# Patient Record
Sex: Female | Born: 1970 | Race: White | Hispanic: No | Marital: Single
Health system: Southern US, Community
[De-identification: ages and names within clinical notes are randomized; demographics above are authoritative.]

---

## 2020-10-06 ENCOUNTER — Emergency Department (HOSPITAL_COMMUNITY): Payer: 59

## 2020-10-06 ENCOUNTER — Encounter (HOSPITAL_COMMUNITY): Payer: Self-pay | Admitting: Emergency Medicine

## 2020-10-06 ENCOUNTER — Emergency Department (HOSPITAL_COMMUNITY)
Admission: EM | Admit: 2020-10-06 | Discharge: 2020-10-06 | Disposition: A | Discharge status: Home / Self Care | Payer: 59 | Attending: Emergency Medicine | Admitting: Emergency Medicine

## 2020-10-06 DIAGNOSIS — Z5321 Procedure and treatment not carried out due to patient leaving prior to being seen by health care provider: Secondary | ICD-10-CM | POA: Diagnosis not present

## 2020-10-06 DIAGNOSIS — R202 Paresthesia of skin: Secondary | ICD-10-CM | POA: Diagnosis not present

## 2020-10-06 DIAGNOSIS — R079 Chest pain, unspecified: Secondary | ICD-10-CM | POA: Diagnosis not present

## 2020-10-06 LAB — TROPONIN I (HIGH SENSITIVITY): Troponin I (High Sensitivity): 4 ng/L (ref <18)

## 2020-10-06 LAB — CBC
HCT: 43.9 % (ref 36.0–46.0)
Hemoglobin: 13.8 g/dL (ref 12.0–15.0)
MCH: 28.3 pg (ref 26.0–34.0)
MCHC: 31.4 g/dL (ref 30.0–36.0)
MCV: 90 fL (ref 80.0–100.0)
Platelets: 264 10*3/uL (ref 150–400)
RBC: 4.88 MIL/uL (ref 3.87–5.11)
RDW: 13.1 % (ref 11.5–15.5)
WBC: 9.6 10*3/uL (ref 4.0–10.5)
nRBC: 0 % (ref 0.0–0.2)

## 2020-10-06 LAB — BASIC METABOLIC PANEL
Anion gap: 12 (ref 5–15)
BUN: 14 mg/dL (ref 6–20)
CO2: 22 mmol/L (ref 22–32)
Calcium: 9.2 mg/dL (ref 8.9–10.3)
Chloride: 103 mmol/L (ref 98–111)
Creatinine, Ser: 0.77 mg/dL (ref 0.44–1.00)
GFR, Estimated: >60 mL/min (ref >60)
Glucose, Bld: 99 mg/dL (ref 70–99)
Potassium: 4.1 mmol/L (ref 3.5–5.1)
Sodium: 137 mmol/L (ref 135–145)

## 2020-10-06 LAB — I-STAT BETA HCG BLOOD, ED (NOT ORDERABLE): I-stat hCG, quantitative: <5 m[IU]/mL (ref <5)

## 2020-10-06 NOTE — ED Triage Notes (Addendum)
Patient c/o sharp left chest pain today with tingling in left arm. States tingling in arm has been intermittent for a few days but chest pain started today. State scheduled for shoulder surgery next week after injury months ago.

## 2022-01-26 IMAGING — CR DG CHEST 2V
2 series · 2 of 2 positions shown · non-contrast
Comparison: None.

CLINICAL DATA: Sharp left-sided chest pain with dizziness and
nausea.

EXAM:
CHEST - 2 VIEW

[w chest pa]
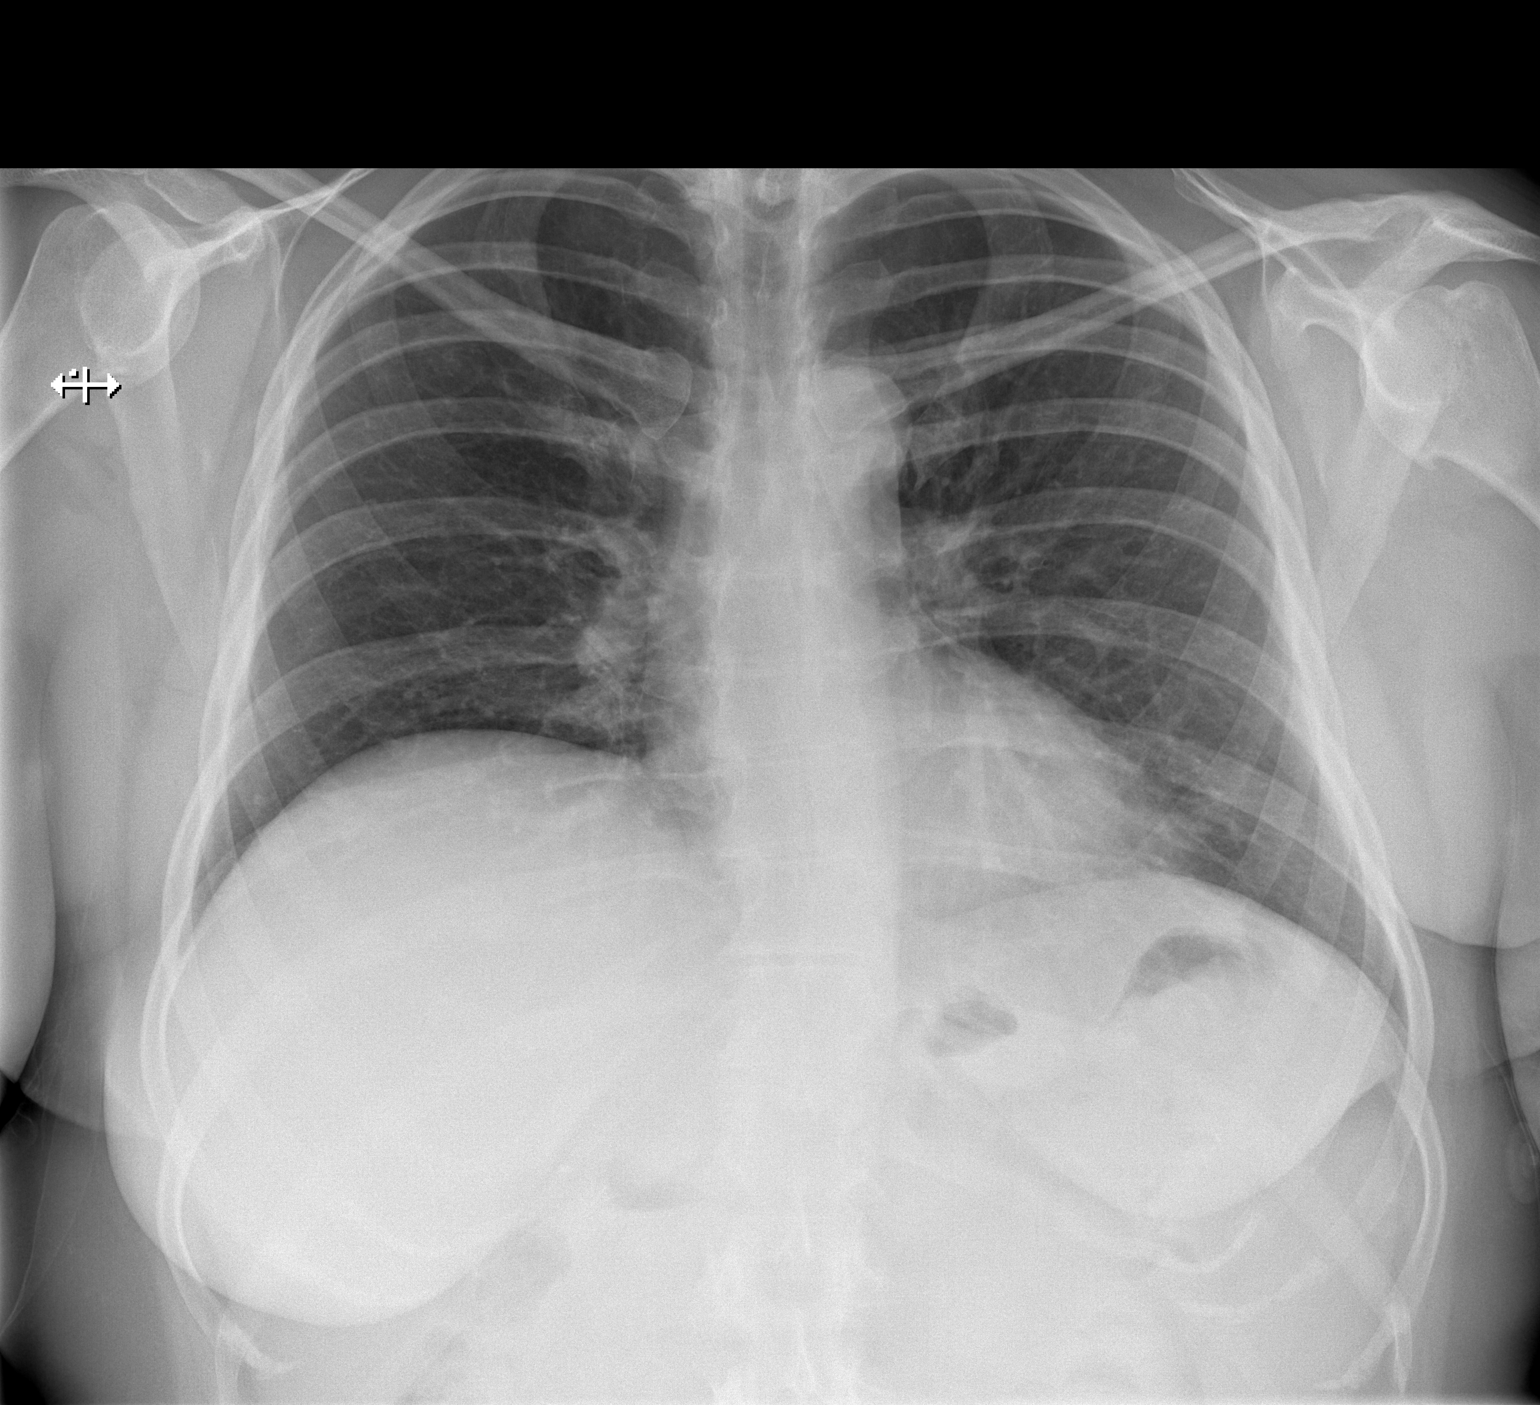

[w chest lat]
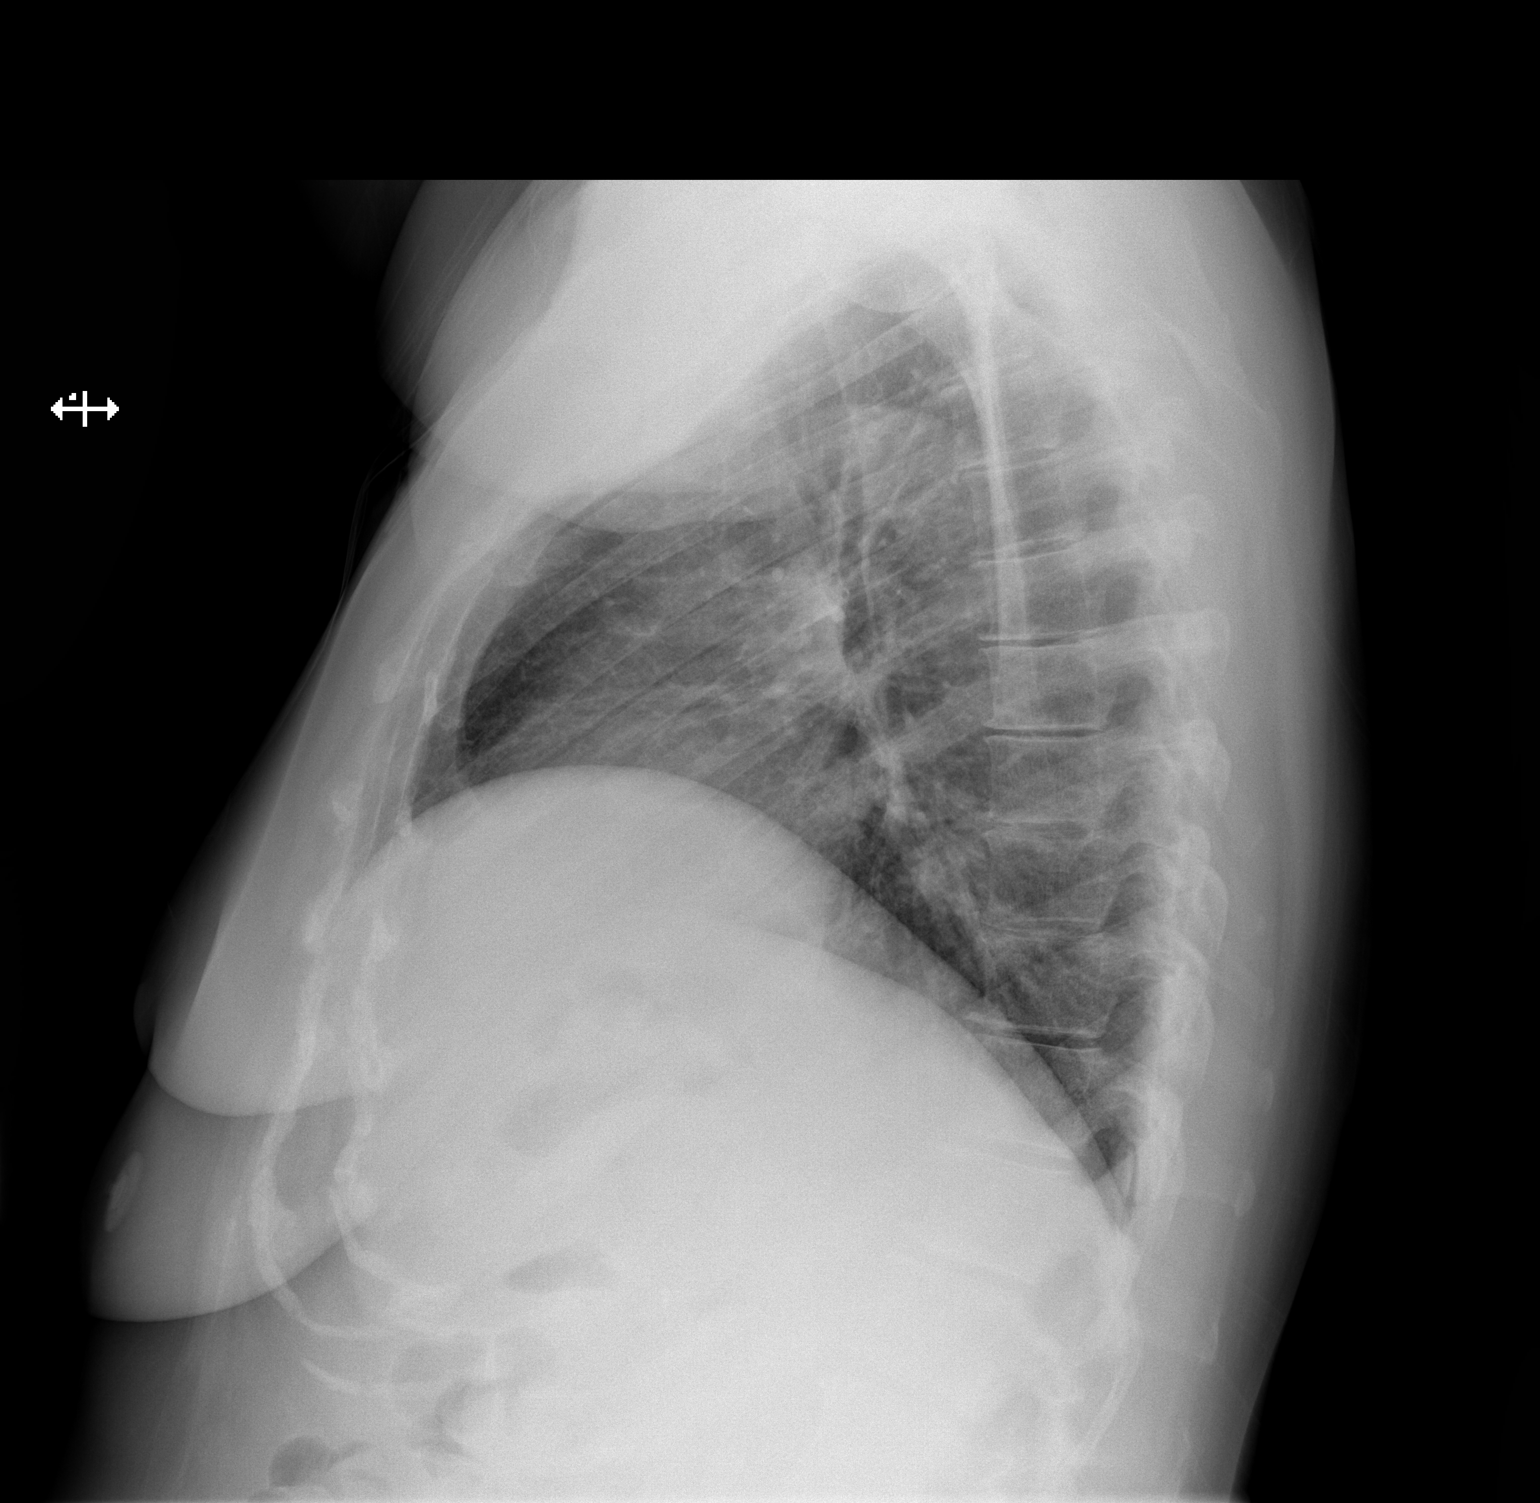

[2 of 2 positions shown; findings below may reference images not displayed]

FINDINGS: The heart size and mediastinal contours are within normal limits. No
focal consolidation. No pneumothorax. No pleural effusion. The
visualized skeletal structures are unremarkable.
IMPRESSION: No active cardiopulmonary disease.
